# Patient Record
Sex: Male | Born: 1995 | Hispanic: Yes | Marital: Married | Smoking: Never smoker
Health system: Southern US, Community
[De-identification: ages and names within clinical notes are randomized; demographics above are authoritative.]

## PROBLEM LIST (undated history)

## (undated) DIAGNOSIS — J189 Pneumonia, unspecified organism: Secondary | ICD-10-CM

---

## 2014-02-11 ENCOUNTER — Emergency Department (HOSPITAL_BASED_OUTPATIENT_CLINIC_OR_DEPARTMENT_OTHER): Payer: Self-pay

## 2014-02-11 ENCOUNTER — Emergency Department (HOSPITAL_BASED_OUTPATIENT_CLINIC_OR_DEPARTMENT_OTHER)
Admission: EM | Admit: 2014-02-11 | Discharge: 2014-02-11 | Disposition: A | Discharge status: Home / Self Care | Payer: Self-pay | Attending: Emergency Medicine | Admitting: Emergency Medicine

## 2014-02-11 ENCOUNTER — Encounter (HOSPITAL_BASED_OUTPATIENT_CLINIC_OR_DEPARTMENT_OTHER): Payer: Self-pay | Admitting: Emergency Medicine

## 2014-02-11 DIAGNOSIS — J9801 Acute bronchospasm: Secondary | ICD-10-CM | POA: Insufficient documentation

## 2014-02-11 DIAGNOSIS — R0602 Shortness of breath: Secondary | ICD-10-CM | POA: Insufficient documentation

## 2014-02-11 HISTORY — DX: Pneumonia, unspecified organism: J18.9

## 2014-02-11 MED ORDER — ALBUTEROL SULFATE HFA 108 (90 BASE) MCG/ACT IN AERS
2.0000 | INHALATION_SPRAY | RESPIRATORY_TRACT | Status: DC | PRN
  Administered 2014-02-11: 2 via RESPIRATORY_TRACT
  Filled 2014-02-11: qty 6.7

## 2014-02-11 MED ORDER — PREDNISONE 20 MG PO TABS: 60.0000 mg | ORAL_TABLET | Freq: Every day | ORAL | Status: DC

## 2014-02-11 MED ORDER — PREDNISONE 50 MG PO TABS
60.0000 mg | ORAL_TABLET | Freq: Once | ORAL | Status: AC
  Administered 2014-02-11: 60 mg via ORAL
  Filled 2014-02-11 (×2): qty 1

## 2014-02-11 MED ORDER — IPRATROPIUM-ALBUTEROL 0.5-2.5 (3) MG/3ML IN SOLN
3.0000 mL | RESPIRATORY_TRACT | Status: DC
  Administered 2014-02-11: 3 mL via RESPIRATORY_TRACT
  Filled 2014-02-11: qty 3

## 2014-02-11 NOTE — ED Notes (Signed)
Pt reports that he awoke an hour ago with shortness of breath and felt dizzy when he stood up, tx for pna 1 month ago at [employer]

## 2014-02-11 NOTE — ED Provider Notes (Signed)
CSN: [id number]     Arrival date & time 02/11/14  0424 History   First MD Initiated Contact with Patient 02/11/14 [phone]     Chief Complaint  Patient presents with  . Shortness of Breath     (Consider location/radiation/quality/duration/timing/severity/associated sxs/prior Treatment) Patient is a 18 y.o. male presenting with shortness of breath. The history is provided by the patient.  Shortness of Breath He woke up with a feeling of being short of breath. He denies any chest pain, heaviness, tightness, pressure. He denies fever, chills, sweats. There is been no cough. He states this is the way he felt when he had been treated for pneumonia one month ago. Nothing makes symptoms better nothing makes them worse. He is a nonsmoker.  Past Medical History  Diagnosis Date  . Pneumonia    History reviewed. No pertinent past surgical history. History reviewed. No pertinent family history. History  Substance Use Topics  . Smoking status: Never Smoker   . Smokeless tobacco: Not on file  . Alcohol Use: No    Review of Systems  Respiratory: Positive for shortness of breath.   All other systems reviewed and are negative.     Allergies  Review of patient's allergies indicates no known allergies.  Home Medications   Prior to Admission medications   Not on File   BP 118/72  Pulse 74  Temp(Src) 97.8 F (36.6 C) (Oral)  Resp 22  Ht  (1.626 m)  Wt 148 lb (67.132 kg)  BMI 25.39 kg/m2  SpO2 99% Physical Exam  Nursing note and vitals reviewed.  18 year old male, resting comfortably and in no acute distress. Vital signs are significant for mild tachypnea. Oxygen saturation is 99%, which is normal. Head is normocephalic and atraumatic. PERRLA, EOMI. Oropharynx is clear. Neck is nontender and supple without adenopathy or JVD. Back is nontender and there is no CVA tenderness. Lungs are clear without rales, wheezes, or rhonchi. There is a slightly prolonged exhalation phase. Chest  is nontender. Heart has regular rate and rhythm without murmur. Abdomen is soft, flat, nontender without masses or hepatosplenomegaly and peristalsis is normoactive. Extremities have no cyanosis or edema, full range of motion is present. Skin is warm and dry without rash. Neurologic: Mental status is normal, cranial nerves are intact, there are no motor or sensory deficits.  ED Course  Procedures (including critical care time)  Imaging Review Dg Chest 2 View  02/11/2014   CLINICAL DATA:  Shortness of breath and dizziness. History of pneumonia a month ago.  EXAM: CHEST  2 VIEW  COMPARISON:  None.  FINDINGS: The heart size and mediastinal contours are within normal limits. Both lungs are clear. The visualized skeletal structures are unremarkable.  IMPRESSION: No active cardiopulmonary disease.   Electronically Signed   By: Burman Nieves M.D.   On: 02/11/2014 04:46   MDM   Final diagnoses:  Bronchospasm    Dyspnea which is most likely related to bronchospasm. He will be given a therapeutic trial of albuterol with ipratropium.  He feels much better after above noted treatment. Chest x-ray is unremarkable. He is discharged with prescriptions for prednisone and he is given albuterol inhaler to take home.  Dione Booze, MD 02/11/14 [phone]

## 2014-02-11 NOTE — Discharge Instructions (Signed)
Bronchospasm A bronchospasm is when the tubes that carry air in and out of your lungs (airways) spasm or tighten. During a bronchospasm it is hard to breathe. This is because the airways get smaller. A bronchospasm can be triggered by:  Allergies. These may be to animals, pollen, food, or mold.  Infection. This is a common cause of bronchospasm.  Exercise.  Irritants. These include pollution, cigarette smoke, strong odors, aerosol sprays, and paint fumes.  Weather changes.  Stress.  Being emotional. HOME CARE   Always have a plan for getting help. Know when to call your doctor and local emergency services (911 in the [place] Know where you can get emergency care.  Only take medicines as told by your doctor.  If you were prescribed an inhaler or nebulizer machine, ask your doctor how to use it correctly. Always use a spacer with your inhaler if you were given one.  Stay calm during an attack. Try to relax and breathe more slowly.  Control your home environment:  Change your heating and air conditioning filter at least once a month.  Limit your use of fireplaces and wood stoves.  Do not  smoke. Do not  allow smoking in your home.  Avoid perfumes and fragrances.  Get rid of pests (such as roaches and mice) and their droppings.  Throw away plants if you see mold on them.  Keep your house clean and dust free.  Replace carpet with wood, tile, or vinyl flooring. Carpet can trap dander and dust.  Use allergy-proof pillows, mattress covers, and box spring covers.  Wash bed sheets and blankets every week in hot water. Dry them in a dryer.  Use blankets that are made of polyester or cotton.  Wash hands frequently. GET HELP IF:  You have muscle aches.  You have chest pain.  The thick spit you spit or cough up (sputum) changes from clear or white to yellow, green, gray, or bloody.  The thick spit you spit or cough up gets thicker.  There are problems that may be related  to the medicine you are given such as:  A rash.  Itching.  Swelling.  Trouble breathing. GET HELP RIGHT AWAY IF:  You feel you cannot breathe or catch your breath.  You cannot stop coughing.  Your treatment is not helping you breathe better.  You have very bad chest pain. MAKE SURE YOU:   Understand these instructions.  Will watch your condition.  Will get help right away if you are not doing well or get worse. Document Released: 04/01/2009 Document Revised: 06/09/2013 Document Reviewed: 11/25/2012 [facility] Patient Information 2015 [place], [place]. This information is not intended to replace advice given to you by your health care provider. Make sure you discuss any questions you have with your health care provider.  Albuterol inhalation aerosol What is this medicine? ALBUTEROL (al Gaspar Bidding) is a bronchodilator. It helps open up the airways in your lungs to make it easier to breathe. This medicine is used to treat and to prevent bronchospasm. This medicine may be used for other purposes; ask your health care provider or pharmacist if you have questions. COMMON BRAND NAME(S): Proair HFA, Proventil, Proventil HFA, Respirol, Ventolin, Ventolin HFA What should I tell my health care provider before I take this medicine? They need to know if you have any of the following conditions: -diabetes -heart disease or irregular heartbeat -high blood pressure -pheochromocytoma -seizures -thyroid disease -an unusual or allergic reaction to albuterol, levalbuterol, sulfites, other  medicines, foods, dyes, or preservatives -pregnant or trying to get pregnant -breast-feeding How should I use this medicine? This medicine is for inhalation through the mouth. Follow the directions on your prescription label. Take your medicine at regular intervals. Do not use more often than directed. Make sure that you are using your inhaler correctly. Ask you doctor or health care provider if you have any  questions. Talk to your pediatrician regarding the use of this medicine in children. Special care may be needed. Overdosage: If you think you have taken too much of this medicine contact a poison control center or emergency room at once. NOTE: This medicine is only for you. Do not share this medicine with others. What if I miss a dose? If you miss a dose, use it as soon as you can. If it is almost time for your next dose, use only that dose. Do not use double or extra doses. What may interact with this medicine? -anti-infectives like chloroquine and pentamidine -caffeine -cisapride -diuretics -medicines for colds -medicines for depression or for emotional or psychotic conditions -medicines for weight loss including some herbal products -methadone -some antibiotics like clarithromycin, erythromycin, levofloxacin, and linezolid -some heart medicines -steroid hormones like dexamethasone, cortisone, hydrocortisone -theophylline -thyroid hormones This list may not describe all possible interactions. Give your health care provider a list of all the medicines, herbs, non-prescription drugs, or dietary supplements you use. Also tell them if you smoke, drink alcohol, or use illegal drugs. Some items may interact with your medicine. What should I watch for while using this medicine? Tell your doctor or health care professional if your symptoms do not improve. Do not use extra albuterol. If your asthma or bronchitis gets worse while you are using this medicine, call your doctor right away. If your mouth gets dry try chewing sugarless gum or sucking hard candy. Drink water as directed. What side effects may I notice from receiving this medicine? Side effects that you should report to your doctor or health care professional as soon as possible: -allergic reactions like skin rash, itching or hives, swelling of the face, lips, or tongue -breathing problems -chest pain -feeling faint or lightheaded,  falls -high blood pressure -irregular heartbeat -fever -muscle cramps or weakness -pain, tingling, numbness in the hands or feet -vomiting Side effects that usually do not require medical attention (report to your doctor or health care professional if they continue or are bothersome): -cough -difficulty sleeping -headache -nervousness or trembling -stomach upset -stuffy or runny nose -throat irritation -unusual taste This list may not describe all possible side effects. Call your doctor for medical advice about side effects. You may report side effects to FDA at 1-800-FDA-1088. Where should I keep my medicine? Keep out of the reach of children. Store at room temperature between 15 and 30 degrees C (59 and 86 degrees F). The contents are under pressure and may burst when exposed to heat or flame. Do not freeze. This medicine does not work as well if it is too cold. Throw away any unused medicine after the expiration date. Inhalers need to be thrown away after the labeled number of puffs have been used or by the expiration date; whichever comes first. Ventolin HFA should be thrown away 12 months after removing from foil pouch. Check the instructions that come with your medicine. NOTE: This sheet is a summary. It may not cover all possible information. If you have questions about this medicine, talk to your doctor, pharmacist, or health care provider.  2015, [employer] Standard. (2012-11-20 10:57:17)   Bronchospasm A bronchospasm is when the tubes that carry air in and out of your lungs (airways) spasm or tighten. During a bronchospasm it is hard to breathe. This is because the airways get smaller. A bronchospasm can be triggered by:  Allergies. These may be to animals, pollen, food, or mold.  Infection. This is a common cause of bronchospasm.  Exercise.  Irritants. These include pollution, cigarette smoke, strong odors, aerosol sprays, and paint fumes.  Weather  changes.  Stress.  Being emotional. HOME CARE   Always have a plan for getting help. Know when to call your doctor and local emergency services (911 in the [place] Know where you can get emergency care.  Only take medicines as told by your doctor.  If you were prescribed an inhaler or nebulizer machine, ask your doctor how to use it correctly. Always use a spacer with your inhaler if you were given one.  Stay calm during an attack. Try to relax and breathe more slowly.  Control your home environment:  Change your heating and air conditioning filter at least once a month.  Limit your use of fireplaces and wood stoves.  Do not  smoke. Do not  allow smoking in your home.  Avoid perfumes and fragrances.  Get rid of pests (such as roaches and mice) and their droppings.  Throw away plants if you see mold on them.  Keep your house clean and dust free.  Replace carpet with wood, tile, or vinyl flooring. Carpet can trap dander and dust.  Use allergy-proof pillows, mattress covers, and box spring covers.  Wash bed sheets and blankets every week in hot water. Dry them in a dryer.  Use blankets that are made of polyester or cotton.  Wash hands frequently. GET HELP IF:  You have muscle aches.  You have chest pain.  The thick spit you spit or cough up (sputum) changes from clear or white to yellow, green, gray, or bloody.  The thick spit you spit or cough up gets thicker.  There are problems that may be related to the medicine you are given such as:  A rash.  Itching.  Swelling.  Trouble breathing. GET HELP RIGHT AWAY IF:  You feel you cannot breathe or catch your breath.  You cannot stop coughing.  Your treatment is not helping you breathe better.  You have very bad chest pain. MAKE SURE YOU:   Understand these instructions.  Will watch your condition.  Will get help right away if you are not doing well or get worse. Document Released: 04/01/2009 Document  Revised: 06/09/2013 Document Reviewed: 11/25/2012 [facility] Patient Information 2015 [place], [place]. This information is not intended to replace advice given to you by your health care provider. Make sure you discuss any questions you have with your health care provider.

## 2014-06-21 ENCOUNTER — Emergency Department (HOSPITAL_BASED_OUTPATIENT_CLINIC_OR_DEPARTMENT_OTHER)
Admission: EM | Admit: 2014-06-21 | Discharge: 2014-06-22 | Disposition: A | Discharge status: Home / Self Care | Payer: Self-pay | Attending: Emergency Medicine | Admitting: Emergency Medicine

## 2014-06-21 ENCOUNTER — Encounter (HOSPITAL_BASED_OUTPATIENT_CLINIC_OR_DEPARTMENT_OTHER): Payer: Self-pay | Admitting: *Deleted

## 2014-06-21 ENCOUNTER — Emergency Department (HOSPITAL_BASED_OUTPATIENT_CLINIC_OR_DEPARTMENT_OTHER): Payer: Self-pay

## 2014-06-21 DIAGNOSIS — Z8701 Personal history of pneumonia (recurrent): Secondary | ICD-10-CM | POA: Insufficient documentation

## 2014-06-21 DIAGNOSIS — R0602 Shortness of breath: Secondary | ICD-10-CM

## 2014-06-21 DIAGNOSIS — R0600 Dyspnea, unspecified: Secondary | ICD-10-CM | POA: Insufficient documentation

## 2014-06-21 DIAGNOSIS — R079 Chest pain, unspecified: Secondary | ICD-10-CM | POA: Insufficient documentation

## 2014-06-21 NOTE — ED Notes (Signed)
Pt reports SOB and pain in his chest with deep inspiration x 1 week.  Pt reports pain worsens with leaning forward.  Deneis cough, fever

## 2014-06-21 NOTE — ED Provider Notes (Signed)
CSN: [id number]     Arrival date & time 06/21/14  2046 History  This chart was scribed for Hanley Seamen, MD by [place] Peace, ED Scribe. The patient was seen in [facility]. The patient's care was started at 12:08 AM.    Chief Complaint  Patient presents with  . Shortness of Breath      Patient is a 19 y.o. male presenting with shortness of breath. The history is provided by the patient. No language interpreter was used.  Shortness of Breath Severity:  Moderate Duration:  1 week Timing:  Constant Progression:  Unchanged Worsened by:  Movement and deep breathing Ineffective treatments:  None tried Associated symptoms: chest pain (with deep breathing)   Associated symptoms: no cough, no fever, no sore throat and no vomiting     HPI Comments: Jemmie Keetch is a 19 y.o. male who presents to the Emergency Department complaining of SOB onset 1 week ago and developed left axillary pain with deep breathing yesterday. He reports pain is exacerbated when he leans forward. No complaints of cough, fever, rhinorrhea, sore throat, nausea, or vomiting. Pt is non-smoker.    Past Medical History  Diagnosis Date  . Pneumonia    History reviewed. No pertinent past surgical history. History reviewed. No pertinent family history. History  Substance Use Topics  . Smoking status: Never Smoker   . Smokeless tobacco: Not on file  . Alcohol Use: No    Review of Systems  Constitutional: Negative for fever.  HENT: Negative for rhinorrhea and sore throat.   Respiratory: Positive for shortness of breath. Negative for cough.   Cardiovascular: Positive for chest pain (with deep breathing).  Gastrointestinal: Negative for nausea and vomiting.  All other systems reviewed and are negative.     Allergies  Review of patient's allergies indicates no known allergies.  Home Medications   Prior to Admission medications   Not on File   BP 129/79 mmHg  Pulse 78  Temp(Src) 98.2 F (36.8 C) (Oral)  Resp  18  Ht  (1.626 m)  Wt 140 lb (63.504 kg)  BMI 24.02 kg/m2  SpO2 99% Physical Exam General: Well-developed, well-nourished male in no acute distress; appearance consistent with age of record HENT: normocephalic; atraumatic. Pharynx is normal.  Eyes: pupils equal, round and reactive to light; extraocular muscles intact Neck: supple Heart: regular rate and rhythm; no murmurs, rubs or gallops Lungs: clear to auscultation bilaterally Abdomen: soft; nondistended; nontender; no masses or hepatosplenomegaly; bowel sounds present Extremities: No deformity; full range of motion; pulses normal Neurologic: Awake, alert and oriented; motor function intact in all extremities and symmetric; no facial droop Skin: Warm and dry Psychiatric: Normal mood and affect    ED Course  Procedures (including critical care time)   EKG Interpretation   Date/Time:  Tuesday June 22 2014 00:05:41 EST Ventricular Rate:  64 PR Interval:  148 QRS Duration: 106 QT Interval:  404 QTC Calculation: 416 R Axis:   42 Text Interpretation:  Normal sinus rhythm Incomplete right bundle branch  block Borderline ECG No previous ECGs available Confirmed by Jaquanda Wickersham  MD,  Jonny Ruiz ([id number]) on 06/22/2014 12:07:15 AM      12:10 AM- Treatment plan was discussed with patient who verbalizes understanding and agrees.   MDM  Nursing notes and vitals signs, including pulse oximetry, reviewed.  Summary of this visit's results, reviewed by myself:  Imaging Studies: Dg Chest 2 View  06/21/2014   CLINICAL DATA:  Shortness of breath and LEFT chest  pain with deep inspiration for 1 week, pain worsened with lifting  EXAM: CHEST  2 VIEW  COMPARISON:  02/11/2014  FINDINGS: Normal heart size, mediastinal contours, and pulmonary vascularity.  Tiny calcified granuloma lower lateral RIGHT chest unchanged.  Lungs otherwise clear.  No pneumothorax.  Bones unremarkable.  IMPRESSION: No acute abnormalities.   Electronically Signed   By: Ulyses Southward M.D.   On: 06/21/2014 21:33     1:56 AM Dyspnea improved after albuterol and Atrovent treatment.  I personally performed the services described in this documentation, which was scribed in my presence. The recorded information has been reviewed and is accurate.   Hanley Seamen, MD 06/22/14 0201

## 2014-06-22 MED ORDER — IPRATROPIUM-ALBUTEROL 0.5-2.5 (3) MG/3ML IN SOLN
3.0000 mL | RESPIRATORY_TRACT | Status: DC
  Administered 2014-06-22: 3 mL via RESPIRATORY_TRACT
  Filled 2014-06-22: qty 3

## 2014-06-22 MED ORDER — ALBUTEROL SULFATE HFA 108 (90 BASE) MCG/ACT IN AERS
2.0000 | INHALATION_SPRAY | RESPIRATORY_TRACT | Status: DC | PRN
  Administered 2014-06-22: 2 via RESPIRATORY_TRACT
  Filled 2014-06-22: qty 6.7

## 2015-06-04 IMAGING — CR DG CHEST 2V
2 series · 2 of 2 positions shown · non-contrast
Comparison: 02/11/2014

CLINICAL DATA: Shortness of breath and LEFT chest pain with deep
inspiration for 1 week, pain worsened with lifting

EXAM:
CHEST  2 VIEW

[w chest pa]
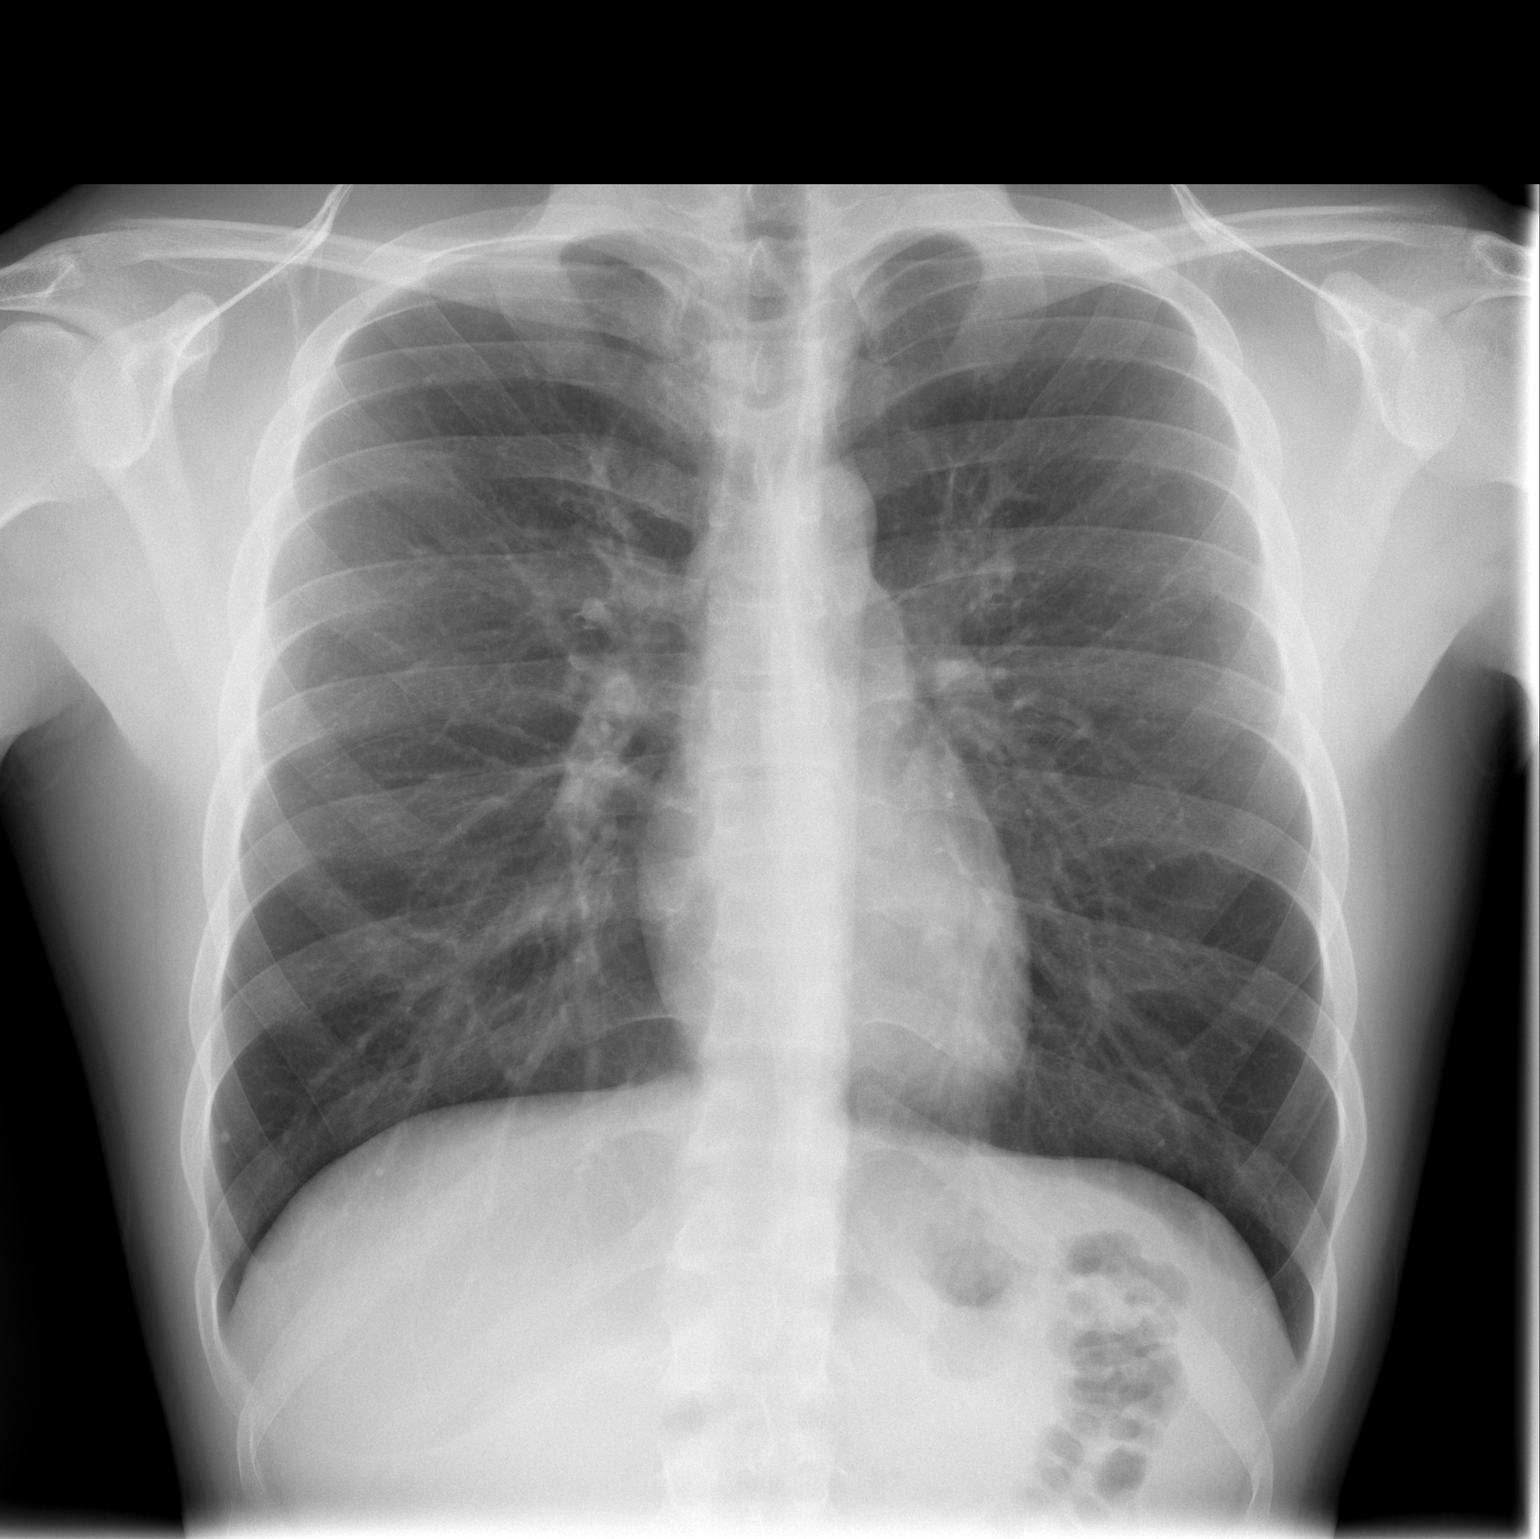

[w chest lat]
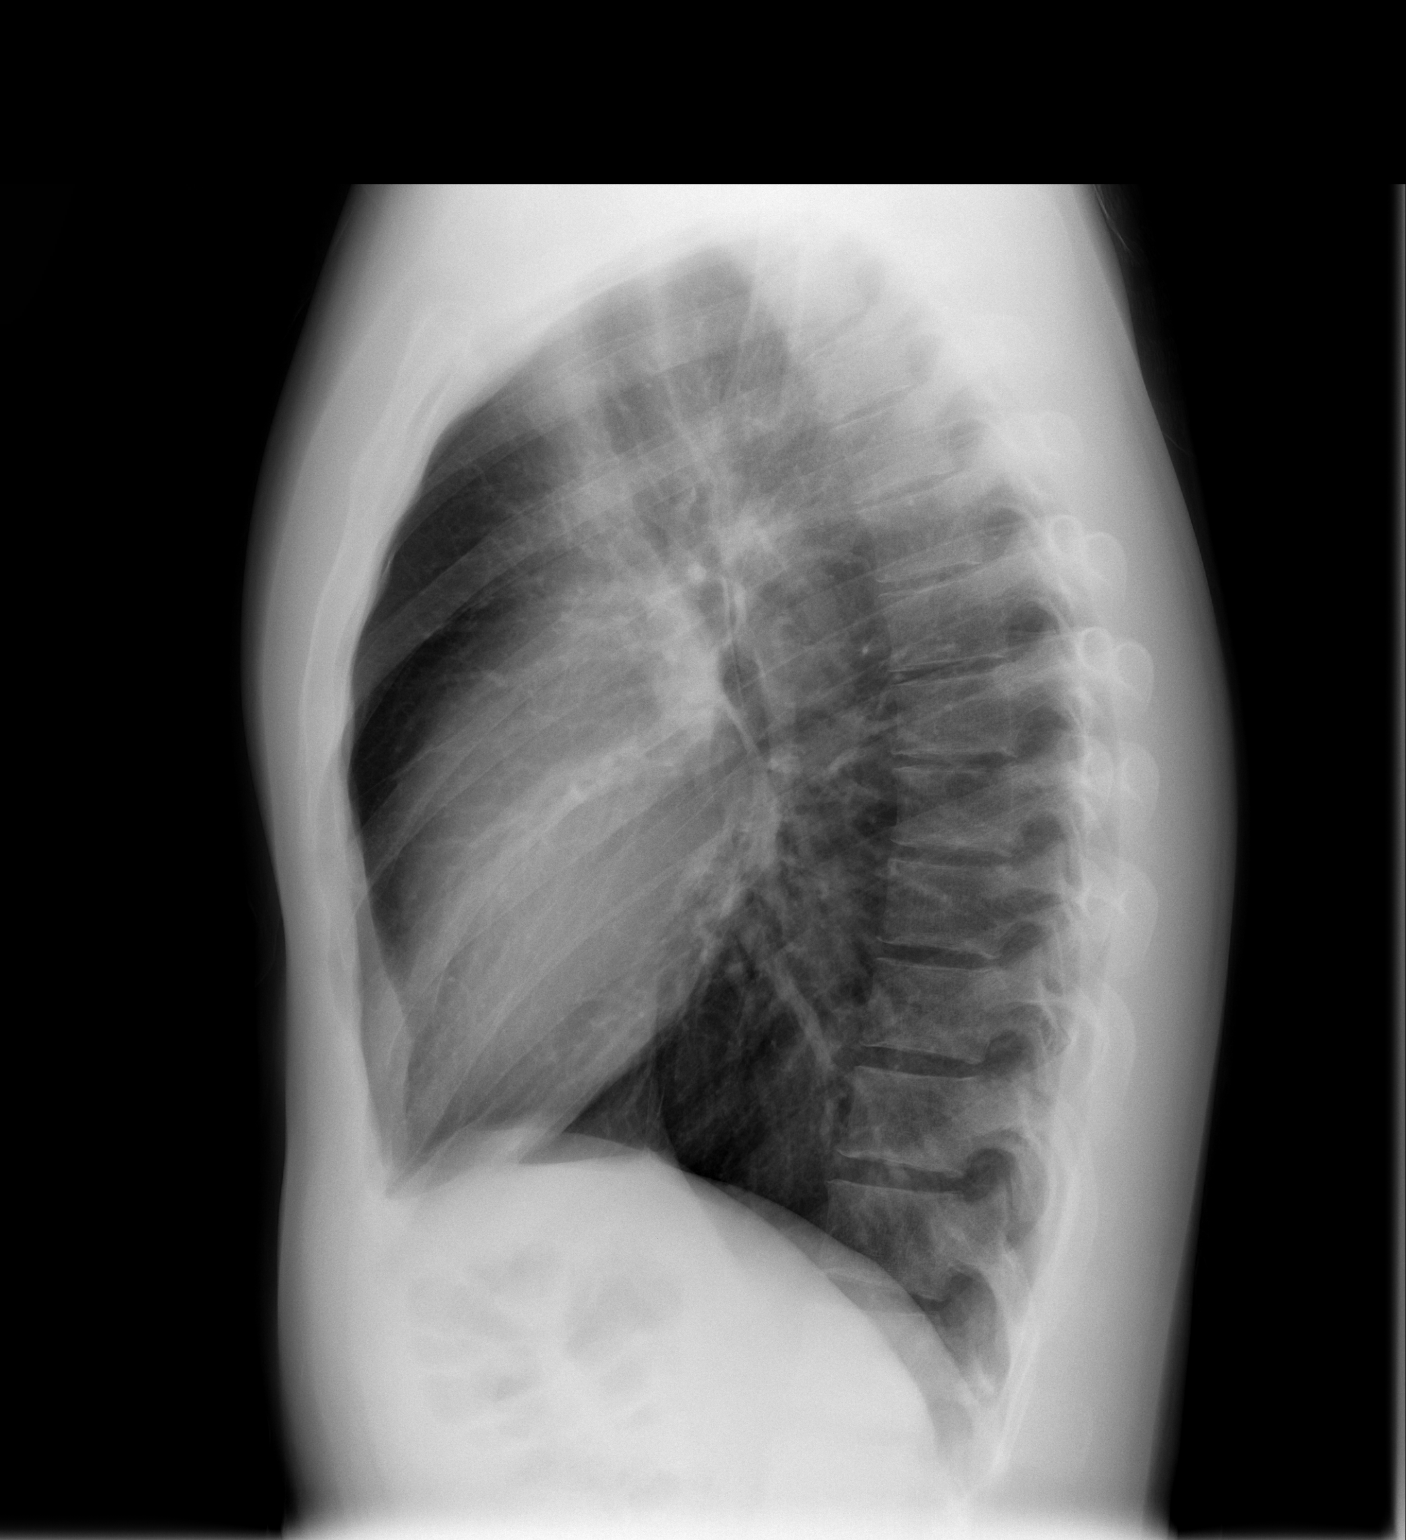

[2 of 2 positions shown; findings below may reference images not displayed]

FINDINGS: Normal heart size, mediastinal contours, and pulmonary vascularity.

Tiny calcified granuloma lower lateral RIGHT chest unchanged.

Lungs otherwise clear.

No pneumothorax.

Bones unremarkable.
IMPRESSION: No acute abnormalities.
